# Patient Record
Sex: Male | Born: 1995 | Race: Black or African American | Hispanic: No | Marital: Single | Smoking: Never smoker
Health system: Southern US, Community
[De-identification: ages and names within clinical notes are randomized; demographics above are authoritative.]

## PROBLEM LIST (undated history)

## (undated) DIAGNOSIS — J939 Pneumothorax, unspecified: Secondary | ICD-10-CM

## (undated) HISTORY — PX: CHEST TUBE INSERTION: SHX231

---

## 2015-01-12 ENCOUNTER — Emergency Department (HOSPITAL_COMMUNITY): Payer: BC Managed Care – PPO

## 2015-01-12 ENCOUNTER — Encounter (HOSPITAL_COMMUNITY): Payer: Self-pay | Admitting: *Deleted

## 2015-01-12 ENCOUNTER — Emergency Department (HOSPITAL_COMMUNITY)
Admission: EM | Admit: 2015-01-12 | Discharge: 2015-01-12 | Disposition: A | Discharge status: Home / Self Care | Payer: BC Managed Care – PPO | Attending: Emergency Medicine | Admitting: Emergency Medicine

## 2015-01-12 DIAGNOSIS — Z8709 Personal history of other diseases of the respiratory system: Secondary | ICD-10-CM | POA: Insufficient documentation

## 2015-01-12 DIAGNOSIS — R079 Chest pain, unspecified: Secondary | ICD-10-CM | POA: Insufficient documentation

## 2015-01-12 HISTORY — DX: Pneumothorax, unspecified: J93.9

## 2015-01-12 LAB — COMPREHENSIVE METABOLIC PANEL
ALBUMIN: 4 g/dL (ref 3.5–5.2)
ALT: 15 U/L (ref 0–53)
ANION GAP: 8 (ref 5–15)
AST: 25 U/L (ref 0–37)
Alkaline Phosphatase: 78 U/L (ref 39–117)
BUN: 9 mg/dL (ref 6–23)
CO2: 25 mmol/L (ref 19–32)
Calcium: 9.2 mg/dL (ref 8.4–10.5)
Chloride: 102 mmol/L (ref 96–112)
Creatinine, Ser: 0.98 mg/dL (ref 0.50–1.35)
GFR calc Af Amer: >90 mL/min (ref >90)
GFR calc non Af Amer: >90 mL/min (ref >90)
Glucose, Bld: 91 mg/dL (ref 70–99)
Potassium: 4.1 mmol/L (ref 3.5–5.1)
Sodium: 135 mmol/L (ref 135–145)
TOTAL PROTEIN: 6.6 g/dL (ref 6.0–8.3)
Total Bilirubin: 0.8 mg/dL (ref 0.3–1.2)

## 2015-01-12 LAB — TROPONIN I: Troponin I: <0.03 ng/mL (ref <0.031)

## 2015-01-12 LAB — CBC WITH DIFFERENTIAL/PLATELET
Basophils Absolute: 0 10*3/uL (ref 0.0–0.1)
Basophils Relative: 0 % (ref 0–1)
EOS PCT: 1 % (ref 0–5)
Eosinophils Absolute: 0 10*3/uL (ref 0.0–0.7)
HCT: 39.6 % (ref 39.0–52.0)
HEMOGLOBIN: 13.8 g/dL (ref 13.0–17.0)
Lymphocytes Relative: 31 % (ref 12–46)
Lymphs Abs: 1.4 10*3/uL (ref 0.7–4.0)
MCH: 33.9 pg (ref 26.0–34.0)
MCHC: 34.8 g/dL (ref 30.0–36.0)
MCV: 97.3 fL (ref 78.0–100.0)
MONO ABS: 0.3 10*3/uL (ref 0.1–1.0)
Monocytes Relative: 7 % (ref 3–12)
NEUTROS ABS: 2.7 10*3/uL (ref 1.7–7.7)
NEUTROS PCT: 61 % (ref 43–77)
Platelets: 173 10*3/uL (ref 150–400)
RBC: 4.07 MIL/uL — ABNORMAL LOW (ref 4.22–5.81)
RDW: 11.7 % (ref 11.5–15.5)
WBC: 4.5 10*3/uL (ref 4.0–10.5)

## 2015-01-12 LAB — I-STAT TROPONIN, ED: Troponin i, poc: 0.01 ng/mL (ref 0.00–0.08)

## 2015-01-12 MED ORDER — IBUPROFEN 800 MG PO TABS: 800.0000 mg | ORAL_TABLET | Freq: Three times a day (TID) | ORAL | Status: AC

## 2015-01-12 NOTE — Discharge Instructions (Signed)
Please call your doctor for a followup appointment within 24-48 hours. When you talk to your doctor please let them know that you were seen in the emergency department and have them acquire all of your records so that they can discuss the findings with you and formulate a treatment plan to fully care for your new and ongoing problems.   Chest Pain (Nonspecific) It is often hard to give a diagnosis for the cause of chest pain. There is always a chance that your pain could be related to something serious, such as a heart attack or a blood clot in the lungs. You need to follow up with your doctor. HOME CARE  If antibiotic medicine was given, take it as directed by your doctor. Finish the medicine even if you start to feel better.  For the next few days, avoid activities that bring on chest pain. Continue physical activities as told by your doctor.  Do not use any tobacco products. This includes cigarettes, chewing tobacco, and e-cigarettes.  Avoid drinking alcohol.  Only take medicine as told by your doctor.  Follow your doctor's suggestions for more testing if your chest pain does not go away.  Keep all doctor visits you made. GET HELP IF:  Your chest pain does not go away, even after treatment.  You have a rash with blisters on your chest.  You have a fever. GET HELP RIGHT AWAY IF:   You have more pain or pain that spreads to your arm, neck, jaw, back, or belly (abdomen).  You have shortness of breath.  You cough more than usual or cough up blood.  You have very bad back or belly pain.  You feel sick to your stomach (nauseous) or throw up (vomit).  You have very bad weakness.  You pass out (faint).  You have chills. This is an emergency. Do not wait to see if the problems will go away. Call your local emergency services (911 in [place] Do not drive yourself to the hospital. MAKE SURE YOU:   Understand these instructions.  Will watch your condition.  Will get help  right away if you are not doing well or get worse. Document Released: 02/19/2008 Document Revised: 09/07/2013 Document Reviewed: 02/19/2008 [facility] Patient Information 2015 [employer], [place]. This information is not intended to replace advice given to you by your health care provider. Make sure you discuss any questions you have with your health care provider.

## 2015-01-12 NOTE — ED Notes (Signed)
Pt with acute onset R post chest pain he describes as tightness that increases with inspiration.  Hx of spontaneous pneumothorax x 2.  Tightness radiates to central chest.

## 2015-01-12 NOTE — ED Provider Notes (Signed)
CSN: [id number]     Arrival date & time 01/12/15  1800 History   First MD Initiated Contact with Patient 01/12/15 1846     Chief Complaint  Patient presents with  . 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 Symptoms started yesterday, slightly worse in class when he was feeling hot, at this time his symptoms are mild to almost gone.  Patient is a 19 y.o. male presenting with chest pain. The history is provided by the patient.  Chest Pain   Past Medical History  Diagnosis Date  . Pneumothorax    Past Surgical History  Procedure Laterality Date  . Chest tube insertion     No family history on file. History  Substance Use Topics  . Smoking status: Never Smoker   . Smokeless tobacco: Not on file  . Alcohol Use: No    Review of Systems  Cardiovascular: Positive for chest pain.  All other systems reviewed and are negative.     Allergies  Review of patient's allergies indicates no known allergies.  Home Medications   Prior to Admission medications   Medication Sig Start Date End Date Taking? Authorizing Provider  ibuprofen (ADVIL,MOTRIN) 800 MG tablet Take 1 tablet (800 mg total) by mouth 3 (three) times daily. 01/12/15   Eber Hong, MD   BP 126/71 mmHg  Pulse 59  Temp(Src) 98.5 F (36.9 C) (Oral)  Resp 18  Ht  (1.905 m)  Wt 170 lb (77.111 kg)  BMI 21.25 kg/m2  SpO2 98% Physical Exam  Constitutional: He appears well-developed and well-nourished. No distress.  HENT:  Head: Normocephalic and atraumatic.  Mouth/Throat: Oropharynx is clear and moist. No oropharyngeal exudate.  Eyes:  Conjunctivae and EOM are normal. Pupils are equal, round, and reactive to light. Right eye exhibits no discharge. Left eye exhibits no discharge. No scleral icterus.  Neck: Normal range of motion. Neck supple. No JVD present. No thyromegaly present.  Cardiovascular: Normal rate, regular rhythm, normal heart sounds and intact distal pulses.  Exam reveals no gallop and no friction rub.   No murmur heard. Pulmonary/Chest: Effort normal and breath sounds normal. No respiratory distress. He has no wheezes. He has no rales.  Abdominal: Soft. Bowel sounds are normal. He exhibits no distension and no mass. There is no tenderness.  Musculoskeletal: Normal range of motion. He exhibits no edema or tenderness.  Lymphadenopathy:    He has no cervical adenopathy.  Neurological: He is alert. Coordination normal.  Skin: Skin is warm and dry. No rash noted. No erythema.  Psychiatric: He has a normal mood and affect. His behavior is normal.  Nursing note and vitals reviewed.   ED Course  Procedures (including critical care time) Labs Review Labs Reviewed  CBC WITH DIFFERENTIAL/PLATELET - Abnormal; Notable for the following:    RBC 4.07 (*)    All other components within normal limits  TROPONIN I  COMPREHENSIVE METABOLIC PANEL  I-STAT TROPOININ, ED    Imaging Review Dg Chest 2 View  01/12/2015   CLINICAL DATA:  19 year old male with acute onset right posterior chest pain worse with inspiration. Patient has a history of prior spontaneous pneumothorax.  EXAM: CHEST  2 VIEW  COMPARISON:  None.  FINDINGS: The lungs are clear and negative for focal airspace consolidation, pulmonary edema or suspicious pulmonary nodule. Changes sutures in the left upper quadrant suggest prior lung surgery, presumably blebectomy given history of prior spontaneous pneumothorax. No pleural effusion or pneumothorax. Cardiac and mediastinal contours are within normal limits. No acute fracture or lytic or blastic osseous lesions. The  visualized upper abdominal bowel gas pattern is unremarkable.  IMPRESSION: No active cardiopulmonary disease.   Electronically Signed   By: Malachy MoanHeath  McCullough M.D.   On: 01/12/2015 20:02    ED ECG REPORT  I personally interpreted this EKG   Date: 01/12/2015   Rate: 69  Rhythm: normal sinus rhythm  QRS Axis: normal  Intervals: normal  ST/T Wave abnormalities: ST elevations diffusely  Conduction Disutrbances:none  Narrative Interpretation: early repolarization  Old EKG Reviewed: none available   MDM   Final diagnoses:  Chest pain, unspecified chest pain type    The patient has no tenderness over the chest wall, his vital signs are normal, he is not hypoxic, he has a normal bedside ultrasound showing no signs of pneumothorax, follow-up with chest x-ray to confirm no pneumothorax otherwise the patient can be safely discharged on ibuprofen. Doubt cardiac or pulmonary etiology if no pneumothorax.  Emergency Ultrasound: Limited Thoracic Performed and interpreted by Dr Hyacinth MeekerMiller Longitudinal view of anterior left and right lung fields in real-time with linear probe. Indication: Chest pain / prior hx of PTX Findings: + lung sliding + B lines, normal M mode Interpretation: No evidence of pneumothorax. Images electronically archived.    Xray read as neg, pt stable for d/c.  Meds given in ED:  Medications - No data to display  New Prescriptions   IBUPROFEN (ADVIL,MOTRIN) 800 MG TABLET    Take 1 tablet (800 mg total) by mouth 3 (three) times daily.      Eber HongBrian Darryle Dennie, MD 01/12/15 2012

## 2016-08-29 IMAGING — DX DG CHEST 2V
2 series · 2 of 2 positions shown · non-contrast
Comparison: None.

CLINICAL DATA: 19-year-old male with acute onset right posterior
chest pain worse with inspiration. Patient has a history of prior
spontaneous pneumothorax.

EXAM:
CHEST  2 VIEW

[chest pa]
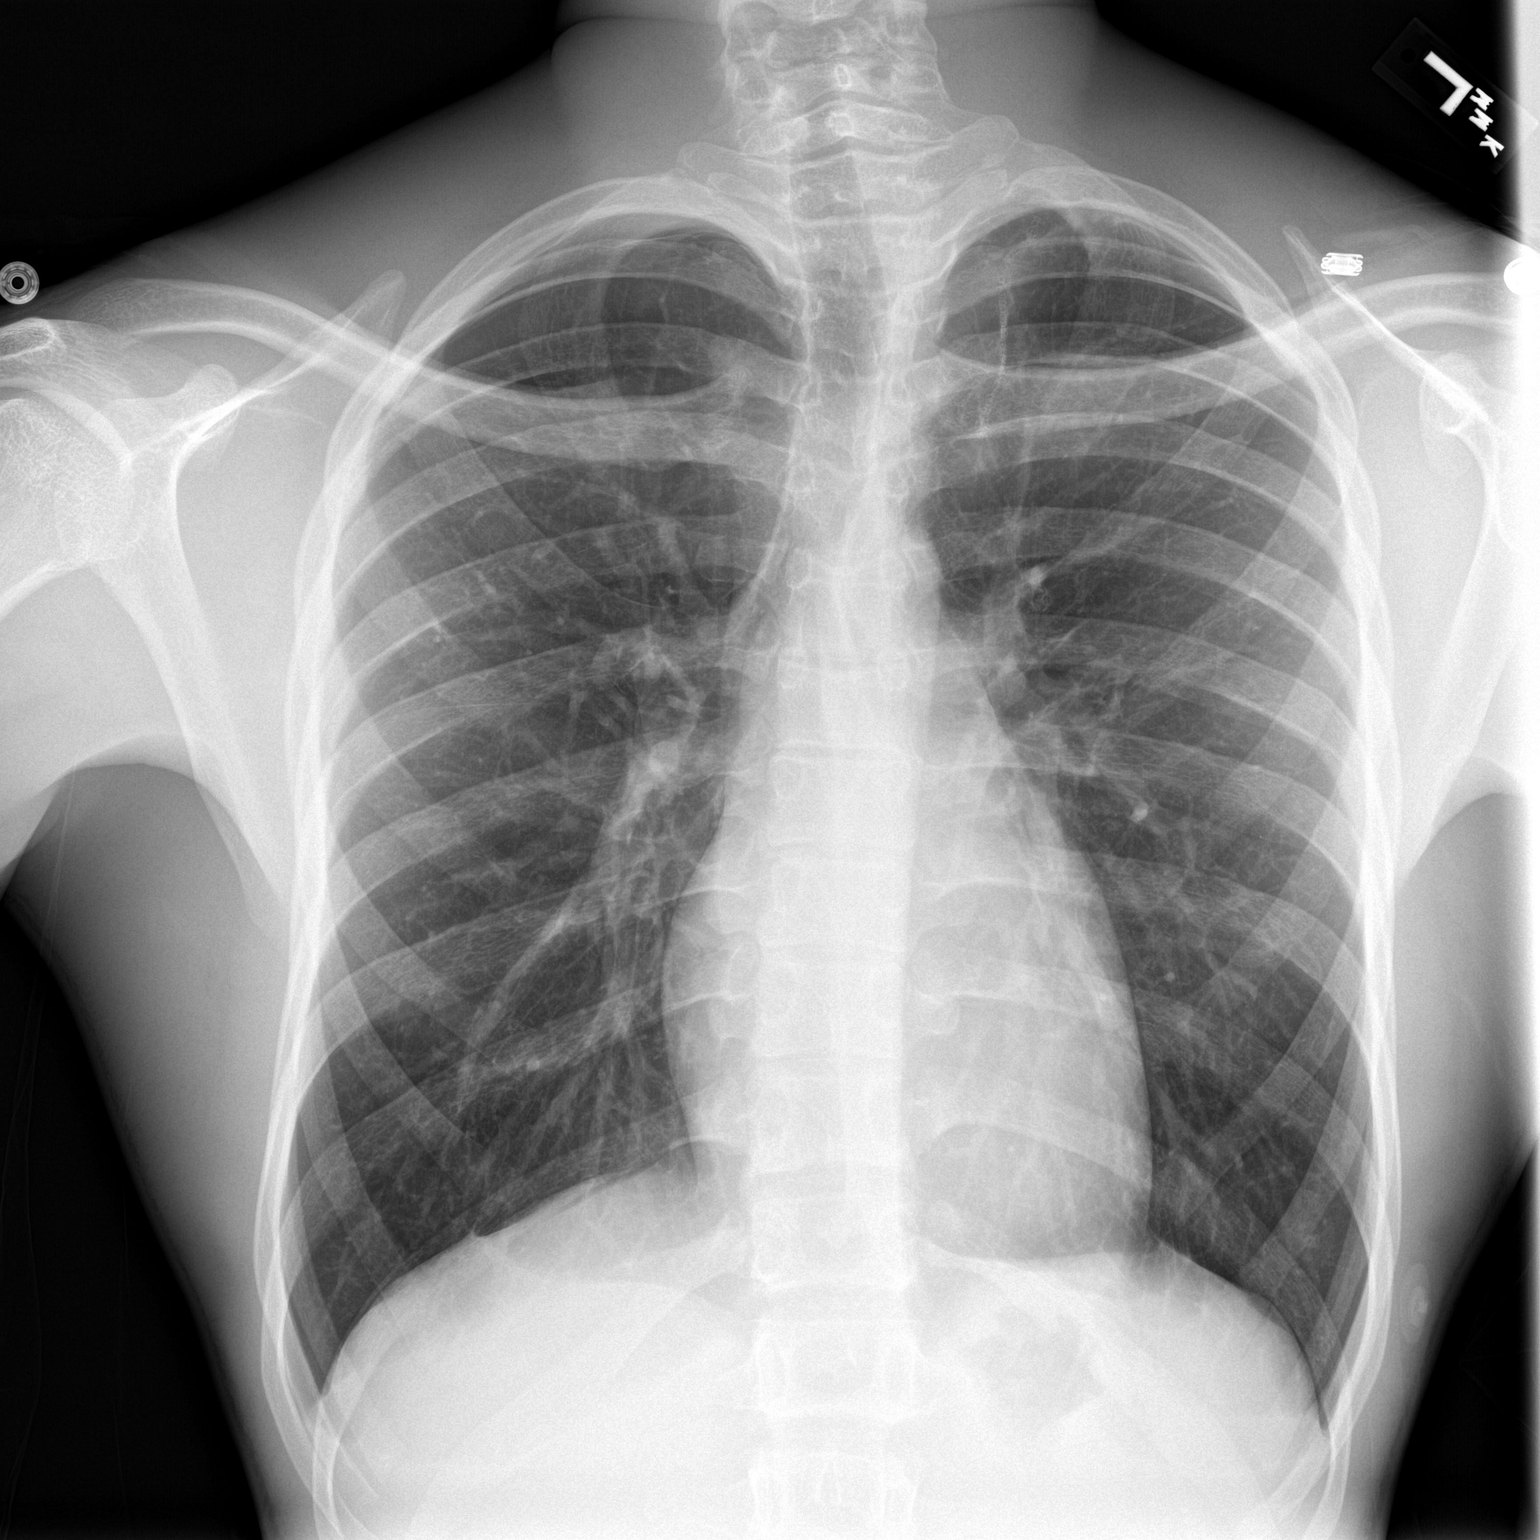

[chest lat]
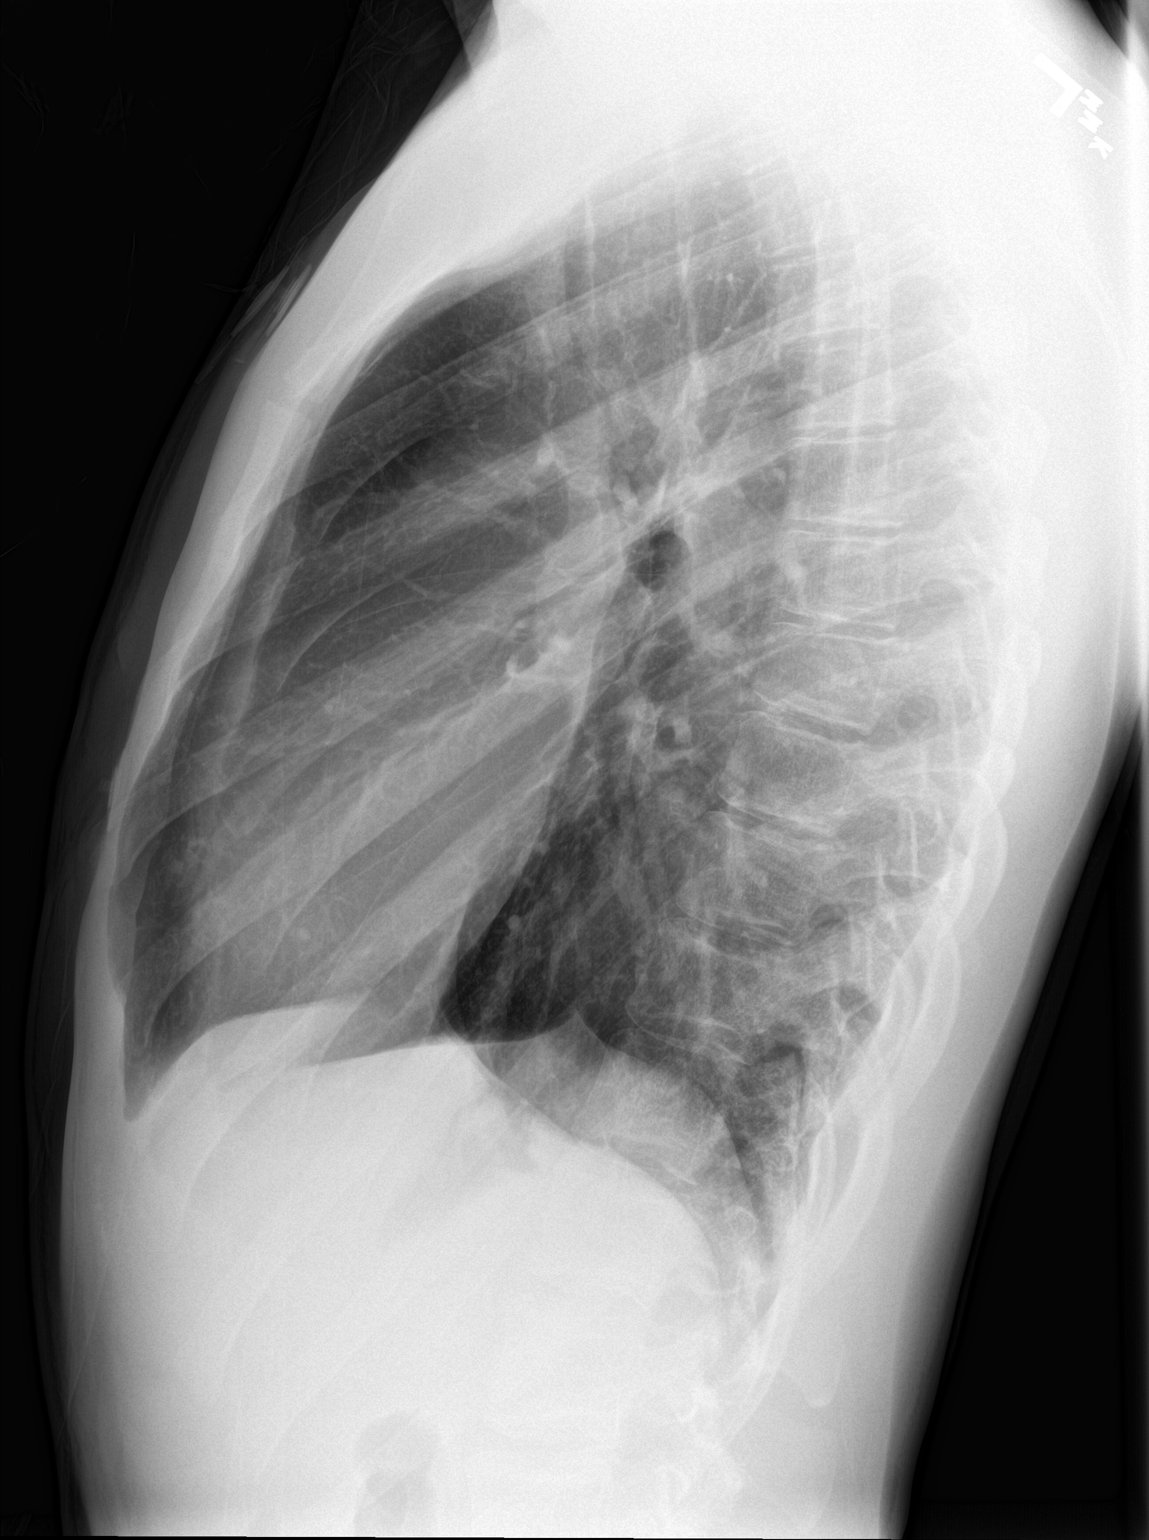

[2 of 2 positions shown; findings below may reference images not displayed]

FINDINGS: The lungs are clear and negative for focal airspace consolidation,
pulmonary edema or suspicious pulmonary nodule. Changes sutures in
the left upper quadrant suggest prior lung surgery, presumably
blebectomy given history of prior spontaneous pneumothorax. No
pleural effusion or pneumothorax. Cardiac and mediastinal contours
are within normal limits. No acute fracture or lytic or blastic
osseous lesions. The visualized upper abdominal bowel gas pattern is
unremarkable.
IMPRESSION: No active cardiopulmonary disease.
